# Patient Record
Sex: Female | Born: 1955 | Race: White | Hispanic: No | Smoking: Current every day smoker
Health system: Southern US, Community
[De-identification: ages and names within clinical notes are randomized; demographics above are authoritative.]

## PROBLEM LIST (undated history)

## (undated) DIAGNOSIS — L88 Pyoderma gangrenosum: Secondary | ICD-10-CM

## (undated) DIAGNOSIS — I10 Essential (primary) hypertension: Secondary | ICD-10-CM

## (undated) DIAGNOSIS — I011 Acute rheumatic endocarditis: Secondary | ICD-10-CM

## (undated) HISTORY — PX: TONSILLECTOMY: SUR1361

## (undated) HISTORY — PX: TUBAL LIGATION: SHX77

---

## 2017-07-29 ENCOUNTER — Emergency Department: Payer: Medicare Other

## 2017-07-29 ENCOUNTER — Emergency Department
Admission: EM | Admit: 2017-07-29 | Discharge: 2017-07-29 | Disposition: A | Discharge status: Home / Self Care | Payer: Medicare Other | Attending: Emergency Medicine | Admitting: Emergency Medicine

## 2017-07-29 DIAGNOSIS — F17210 Nicotine dependence, cigarettes, uncomplicated: Secondary | ICD-10-CM | POA: Insufficient documentation

## 2017-07-29 DIAGNOSIS — E876 Hypokalemia: Secondary | ICD-10-CM | POA: Diagnosis not present

## 2017-07-29 DIAGNOSIS — L03116 Cellulitis of left lower limb: Secondary | ICD-10-CM

## 2017-07-29 DIAGNOSIS — R4182 Altered mental status, unspecified: Secondary | ICD-10-CM | POA: Diagnosis not present

## 2017-07-29 DIAGNOSIS — Y999 Unspecified external cause status: Secondary | ICD-10-CM | POA: Insufficient documentation

## 2017-07-29 DIAGNOSIS — I10 Essential (primary) hypertension: Secondary | ICD-10-CM | POA: Diagnosis not present

## 2017-07-29 DIAGNOSIS — Y9389 Activity, other specified: Secondary | ICD-10-CM | POA: Insufficient documentation

## 2017-07-29 DIAGNOSIS — L080 Pyoderma: Secondary | ICD-10-CM | POA: Insufficient documentation

## 2017-07-29 DIAGNOSIS — Y92410 Unspecified street and highway as the place of occurrence of the external cause: Secondary | ICD-10-CM | POA: Insufficient documentation

## 2017-07-29 DIAGNOSIS — M069 Rheumatoid arthritis, unspecified: Secondary | ICD-10-CM | POA: Diagnosis not present

## 2017-07-29 DIAGNOSIS — B999 Unspecified infectious disease: Secondary | ICD-10-CM

## 2017-07-29 DIAGNOSIS — S299XXA Unspecified injury of thorax, initial encounter: Secondary | ICD-10-CM | POA: Diagnosis present

## 2017-07-29 DIAGNOSIS — S20211A Contusion of right front wall of thorax, initial encounter: Secondary | ICD-10-CM | POA: Insufficient documentation

## 2017-07-29 HISTORY — DX: Acute rheumatic endocarditis: I01.1

## 2017-07-29 HISTORY — DX: Essential (primary) hypertension: I10

## 2017-07-29 HISTORY — DX: Pyoderma gangrenosum: L88

## 2017-07-29 LAB — CBC
HCT: 38.1 % (ref 35.0–47.0)
Hemoglobin: 12.4 g/dL (ref 12.0–16.0)
MCH: 27.8 pg (ref 26.0–34.0)
MCHC: 32.4 g/dL (ref 32.0–36.0)
MCV: 85.9 fL (ref 80.0–100.0)
PLATELETS: 437 10*3/uL (ref 150–440)
RBC: 4.44 MIL/uL (ref 3.80–5.20)
RDW: 18.9 % — ABNORMAL HIGH (ref 11.5–14.5)
WBC: 10.1 10*3/uL (ref 3.6–11.0)

## 2017-07-29 LAB — COMPREHENSIVE METABOLIC PANEL
ALBUMIN: 3.9 g/dL (ref 3.5–5.0)
ALT: 21 U/L (ref 14–54)
AST: 20 U/L (ref 15–41)
Alkaline Phosphatase: 111 U/L (ref 38–126)
Anion gap: 9 (ref 5–15)
BUN: 19 mg/dL (ref 6–20)
CHLORIDE: 109 mmol/L (ref 101–111)
CO2: 22 mmol/L (ref 22–32)
CREATININE: 0.87 mg/dL (ref 0.44–1.00)
Calcium: 9.2 mg/dL (ref 8.9–10.3)
GFR calc non Af Amer: >60 mL/min (ref >60)
GLUCOSE: 95 mg/dL (ref 65–99)
Potassium: 2.8 mmol/L — ABNORMAL LOW (ref 3.5–5.1)
SODIUM: 140 mmol/L (ref 135–145)
Total Bilirubin: 0.6 mg/dL (ref 0.3–1.2)
Total Protein: 7.1 g/dL (ref 6.5–8.1)

## 2017-07-29 LAB — SEDIMENTATION RATE: Sed Rate: 28 mm/hr (ref 0–30)

## 2017-07-29 LAB — LACTIC ACID, PLASMA
LACTIC ACID, VENOUS: 0.7 mmol/L (ref 0.5–1.9)
Lactic Acid, Venous: 0.8 mmol/L (ref 0.5–1.9)

## 2017-07-29 LAB — ETHANOL

## 2017-07-29 MED ORDER — POTASSIUM CHLORIDE ER 20 MEQ PO TBCR: 40.0000 meq | EXTENDED_RELEASE_TABLET | Freq: Every day | ORAL | 0 refills | Status: AC

## 2017-07-29 MED ORDER — ACETAMINOPHEN 500 MG PO TABS
1000.0000 mg | ORAL_TABLET | Freq: Once | ORAL | Status: AC
  Administered 2017-07-29: 1000 mg via ORAL
  Filled 2017-07-29: qty 2

## 2017-07-29 MED ORDER — IOPAMIDOL (ISOVUE-300) INJECTION 61%
75.0000 mL | Freq: Once | INTRAVENOUS | Status: AC | PRN
  Administered 2017-07-29: 75 mL via INTRAVENOUS

## 2017-07-29 MED ORDER — POTASSIUM CHLORIDE CRYS ER 20 MEQ PO TBCR
40.0000 meq | EXTENDED_RELEASE_TABLET | Freq: Once | ORAL | Status: AC
  Administered 2017-07-29: 40 meq via ORAL
  Filled 2017-07-29: qty 2

## 2017-07-29 MED ORDER — PIPERACILLIN-TAZOBACTAM 3.375 G IVPB 30 MIN
3.3750 g | Freq: Once | INTRAVENOUS | Status: AC
  Administered 2017-07-29: 3.375 g via INTRAVENOUS
  Filled 2017-07-29: qty 50

## 2017-07-29 MED ORDER — SODIUM CHLORIDE 0.9 % IV BOLUS
1000.0000 mL | Freq: Once | INTRAVENOUS | Status: AC
  Administered 2017-07-29: 1000 mL via INTRAVENOUS
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
## 2017-07-29 NOTE — Discharge Instructions (Signed)
These take the supplemental potassium tablets and have your primary care physician recheck your potassium level after 3 days.  Keep your appointment with all of your physicians at [facility].  Return to the emergency department if you develop severe pain, lightheadedness or fainting, vomiting, shortness of breath, numbness tingling or weakness, or any other symptoms concerning to you.

## 2017-08-03 LAB — CULTURE, BLOOD (ROUTINE X 2)
CULTURE: NO GROWTH
CULTURE: NO GROWTH
SPECIAL REQUESTS: ADEQUATE
SPECIAL REQUESTS: ADEQUATE
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
## 2020-01-19 DEATH — deceased
